# Patient Record
Sex: Female | Born: 1990
Health system: Southern US, Community
[De-identification: ages and names within clinical notes are randomized; demographics above are authoritative.]

## PROBLEM LIST (undated history)

## (undated) ENCOUNTER — Inpatient Hospital Stay (HOSPITAL_COMMUNITY): Payer: Self-pay

---

## 2015-09-22 ENCOUNTER — Encounter (HOSPITAL_BASED_OUTPATIENT_CLINIC_OR_DEPARTMENT_OTHER): Payer: Self-pay

## 2015-09-22 ENCOUNTER — Emergency Department (HOSPITAL_BASED_OUTPATIENT_CLINIC_OR_DEPARTMENT_OTHER): Payer: Self-pay

## 2015-09-22 ENCOUNTER — Emergency Department (HOSPITAL_BASED_OUTPATIENT_CLINIC_OR_DEPARTMENT_OTHER)
Admission: EM | Admit: 2015-09-22 | Discharge: 2015-09-23 | Disposition: A | Discharge status: Home / Self Care | Payer: Self-pay | Attending: Emergency Medicine | Admitting: Emergency Medicine

## 2015-09-22 DIAGNOSIS — F17200 Nicotine dependence, unspecified, uncomplicated: Secondary | ICD-10-CM | POA: Insufficient documentation

## 2015-09-22 DIAGNOSIS — O200 Threatened abortion: Secondary | ICD-10-CM | POA: Insufficient documentation

## 2015-09-22 DIAGNOSIS — O26891 Other specified pregnancy related conditions, first trimester: Secondary | ICD-10-CM

## 2015-09-22 DIAGNOSIS — R1084 Generalized abdominal pain: Secondary | ICD-10-CM | POA: Insufficient documentation

## 2015-09-22 DIAGNOSIS — O210 Mild hyperemesis gravidarum: Secondary | ICD-10-CM | POA: Insufficient documentation

## 2015-09-22 DIAGNOSIS — R102 Pelvic and perineal pain: Secondary | ICD-10-CM

## 2015-09-22 DIAGNOSIS — R52 Pain, unspecified: Secondary | ICD-10-CM

## 2015-09-22 DIAGNOSIS — O99331 Smoking (tobacco) complicating pregnancy, first trimester: Secondary | ICD-10-CM | POA: Insufficient documentation

## 2015-09-22 DIAGNOSIS — Z3A01 Less than 8 weeks gestation of pregnancy: Secondary | ICD-10-CM | POA: Insufficient documentation

## 2015-09-22 DIAGNOSIS — R109 Unspecified abdominal pain: Secondary | ICD-10-CM

## 2015-09-22 LAB — URINALYSIS, ROUTINE W REFLEX MICROSCOPIC
Bilirubin Urine: NEGATIVE
Glucose, UA: NEGATIVE mg/dL
Hgb urine dipstick: NEGATIVE
Ketones, ur: NEGATIVE mg/dL
LEUKOCYTES UA: NEGATIVE
NITRITE: NEGATIVE
PH: 8.5 — AB (ref 5.0–8.0)
Protein, ur: NEGATIVE mg/dL
SPECIFIC GRAVITY, URINE: 1.015 (ref 1.005–1.030)

## 2015-09-22 LAB — PREGNANCY, URINE: PREG TEST UR: POSITIVE — AB

## 2015-09-22 NOTE — ED Provider Notes (Signed)
CSN: [id number]     Arrival date & time 09/22/15  2213 History  By signing my name below, I, Phillis HaggisGabriella Gaje, attest that this documentation has been prepared under the direction and in the presence of Calab Sachse, MD. Electronically Signed: Phillis HaggisGabriella Gaje, ED Scribe. 09/22/2015. 11:50 PM.  Chief Complaint  Patient presents with  . Abdominal Pain   Patient is a 25 y.o. female presenting with abdominal pain. The history is provided by the patient. No language interpreter was used.  Abdominal Pain Pain location:  Generalized Pain quality: cramping   Pain radiates to:  Does not radiate Pain severity:  Mild Onset quality:  Gradual Timing:  Constant Progression:  Unchanged Chronicity:  New Context: not alcohol use and not trauma   Relieved by:  Nothing Worsened by:  Nothing tried Ineffective treatments:  None tried Associated symptoms: diarrhea, nausea and vomiting   Associated symptoms: no dysuria, no hematuria, no vaginal bleeding and no vaginal discharge   Risk factors: no alcohol abuse   HPI Comments: Leslie Andreaamecca Haynes McCants is a 25 y.o. female who presents to the Emergency Department complaining of right sided abdominal pain onset 2 weeks ago. She reports associated nausea, vomiting x1, and diarrhea. LMP January 2017. She does not have current plans for OB care. She denies vaginal discharge, dysuria, or hematuria.   History reviewed. No pertinent past medical history. Past Surgical History  Procedure Laterality Date  . Cesarean section     No family history on file. Social History  Substance Use Topics  . Smoking status: Current Every Day Smoker  . Smokeless tobacco: None  . Alcohol Use: No   OB History    No data available     Review of Systems  Gastrointestinal: Positive for nausea, vomiting, abdominal pain and diarrhea.  Genitourinary: Negative for dysuria, hematuria, flank pain, vaginal bleeding, vaginal discharge and pelvic pain.  All other systems reviewed and are  negative.  Allergies  Review of patient's allergies indicates no known allergies.  Home Medications   Prior to Admission medications   Not on File   BP 125/74 mmHg  Pulse 89  Temp(Src) 98.7 F (37.1 C) (Oral)  Resp 16  Ht 5\' 3"  (1.6 m)  Wt 154 lb (69.854 kg)  BMI 27.29 kg/m2  SpO2 100%  LMP 07/18/2015 Physical Exam  Constitutional: She is oriented to person, place, and time. She appears well-developed and well-nourished. No distress.  HENT:  Head: Normocephalic and atraumatic.  Mouth/Throat: Oropharynx is clear and moist. No oropharyngeal exudate.  Trachea midline  Eyes: Conjunctivae and EOM are normal. Pupils are equal, round, and reactive to light.  Neck: Trachea normal and normal range of motion. Neck supple. No JVD present. Carotid bruit is not present.  Cardiovascular: Normal rate and regular rhythm.  Exam reveals no gallop and no friction rub.   No murmur heard. Pulmonary/Chest: Effort normal and breath sounds normal. No stridor. She has no wheezes. She has no rales.  Abdominal: Soft. Bowel sounds are normal. She exhibits no mass. There is no tenderness. There is no rebound, no guarding, no tenderness at McBurney's point and negative Murphy's sign.  Genitourinary: Uterus normal.  Musculoskeletal: Normal range of motion.  Lymphadenopathy:    She has no cervical adenopathy.  Neurological: She is alert and oriented to person, place, and time. She has normal reflexes. No cranial nerve deficit. She exhibits normal muscle tone. Coordination normal.  Cranial nerves 2-12 intact; steady gait  Skin: Skin is warm and dry. She is  not diaphoretic.  Psychiatric: She has a normal mood and affect. Her behavior is normal.    ED Course  Procedures (including critical care time) DIAGNOSTIC STUDIES: Oxygen Saturation is 100% on RA, normal by my interpretation.    COORDINATION OF CARE: 11:50 PM-Discussed treatment plan which includes UA and ultrasound with pt at bedside and pt agreed  to plan.    Labs Review Labs Reviewed  URINALYSIS, ROUTINE W REFLEX MICROSCOPIC (NOT AT [facility]) - Abnormal; Notable for the following:    APPearance HAZY (*)    pH 8.5 (*)    All other components within normal limits  PREGNANCY, URINE - Abnormal; Notable for the following:    Preg Test, Ur POSITIVE (*)    All other components within normal limits  HCG, QUANTITATIVE, PREGNANCY    Imaging Review No results found. I have personally reviewed and evaluated these images and lab results as part of my medical decision-making.   EKG Interpretation None      MDM   Final diagnoses:  None    205 am case d/w Dr. Emelda Fear. Labs and [place] reviewed with MD via phone.  Likely miscarriage but will need follow up.  Patient to be seen at [facility] Saturday at 8 am.  MD took patient's info for MAU  Discussed all information with patient, confidentially.  Patient expresses understanding of all information and agrees to follow up at [facility] Saturday at 8 am for repeat blood work  I personally performed the services described in this documentation, which was scribed in my presence. The recorded information has been reviewed and is accurate.       Cy Blamer, MD 09/23/15 [phone]

## 2015-09-22 NOTE — ED Notes (Addendum)
C/o abd pain, n/v/d x 2 weeks-denies vaginal d/c, urinary s/s-NAD-steady gait

## 2015-09-22 NOTE — ED Notes (Signed)
MD at bedside. 

## 2015-09-23 ENCOUNTER — Emergency Department (HOSPITAL_BASED_OUTPATIENT_CLINIC_OR_DEPARTMENT_OTHER): Payer: Self-pay

## 2015-09-23 ENCOUNTER — Encounter (HOSPITAL_BASED_OUTPATIENT_CLINIC_OR_DEPARTMENT_OTHER): Payer: Self-pay | Admitting: Emergency Medicine

## 2015-09-23 LAB — HCG, QUANTITATIVE, PREGNANCY: HCG, BETA CHAIN, QUANT, S: 7267 m[IU]/mL — AB (ref <5)

## 2015-09-23 NOTE — ED Notes (Signed)
MD at bedside updating pt on test results and dispo plan of care.

## 2015-09-25 ENCOUNTER — Encounter (HOSPITAL_COMMUNITY): Payer: Self-pay | Admitting: *Deleted

## 2015-09-25 ENCOUNTER — Inpatient Hospital Stay (HOSPITAL_COMMUNITY)
Admission: AD | Admit: 2015-09-25 | Discharge: 2015-09-25 | Disposition: A | Discharge status: Home / Self Care | Payer: Self-pay | Source: Ambulatory Visit | Attending: Obstetrics & Gynecology | Admitting: Obstetrics & Gynecology

## 2015-09-25 DIAGNOSIS — Z3A01 Less than 8 weeks gestation of pregnancy: Secondary | ICD-10-CM | POA: Insufficient documentation

## 2015-09-25 DIAGNOSIS — O26899 Other specified pregnancy related conditions, unspecified trimester: Secondary | ICD-10-CM

## 2015-09-25 DIAGNOSIS — O9989 Other specified diseases and conditions complicating pregnancy, childbirth and the puerperium: Secondary | ICD-10-CM

## 2015-09-25 DIAGNOSIS — O26891 Other specified pregnancy related conditions, first trimester: Secondary | ICD-10-CM | POA: Insufficient documentation

## 2015-09-25 DIAGNOSIS — R109 Unspecified abdominal pain: Secondary | ICD-10-CM

## 2015-09-25 DIAGNOSIS — R102 Pelvic and perineal pain: Secondary | ICD-10-CM | POA: Insufficient documentation

## 2015-09-25 LAB — HCG, QUANTITATIVE, PREGNANCY: hCG, Beta Chain, Quant, S: 11418 m[IU]/mL — ABNORMAL HIGH (ref <5)

## 2015-09-25 NOTE — MAU Provider Note (Signed)
Kim Mccullough is a 25 y.o. at [redacted]w[redacted]d EGA here for follow up quant HCG. Vaginal bleeding: none. Abdominal pain: none. She was originally seen at [facility] ED for abdominal pain on 09/23/15.   Prior HCGs: 7267 on 09/23/15   Prior U/S:  US Ob Comp Less 14 Wks  09/23/2015  CLINICAL DATA:  Pelvic pain for 1 week. Positive pregnancy test. Unknown dates. LMP was first week of January. Estimated gestational age by a this LMP would be 9 weeks 1 day. No quantitative beta HCG levels available. EXAM: OBSTETRIC <14 WK [place] AND TRANSVAGINAL OB US TECHNIQUE: Both transabdominal and transvaginal ultrasound examinations were performed for complete evaluation of the gestation as well as the maternal uterus, adnexal regions, and pelvic cul-de-sac. Transvaginal technique was performed to assess early pregnancy. COMPARISON:  None. FINDINGS: Intrauterine gestational sac: A single intrauterine gestational sac is visualized. Yolk sac: Internal structure within the gestational sac may represent early yolk sac. Embryo:  Fetal pole is not identified. Cardiac Activity: Not identified. MSD: 8.2  mm   5 w   4  d                  [place] EDC: 05/21/2016 Subchorionic hemorrhage:  Indeterminate. Maternal uterus/adnexae: Uterus is anteverted. No myometrial mass lesions. The endometrium is expanded and heterogeneous. This could represent a large subchorionic hemorrhage or an additional endometrial process such as hemorrhage or hyperplasia. No endometrial fluid collections are demonstrated other than a gestational sac. Both ovaries are visualized and appear normal. Corpus luteum cyst demonstrated on the right. Small amount of free fluid in the pelvis. IMPRESSION: Probable early intrauterine gestational sac, but no definite yolk sac, fetal pole, or cardiac activity yet visualized. Recommend follow-up quantitative B-HCG levels and follow-up US in 14 days to confirm and assess viability. This recommendation follows SRU consensus guidelines:  Diagnostic Criteria for Nonviable Pregnancy Early in the First Trimester. Malva Limes Med 2013; 161:0960-45. Expansion and heterogeneous appearance of the endometrium could represent a large subchorionic hemorrhage versus underlying endometrial process. Follow-up suggested. Electronically Signed   By: Burman Nieves M.D.   On: 09/23/2015 01:07   US Ob Transvaginal  09/23/2015  CLINICAL DATA:  Pelvic pain for 1 week. Positive pregnancy test. Unknown dates. LMP was first week of January. Estimated gestational age by a this LMP would be 9 weeks 1 day. No quantitative beta HCG levels available. EXAM: OBSTETRIC <14 WK [place] AND TRANSVAGINAL OB US TECHNIQUE: Both transabdominal and transvaginal ultrasound examinations were performed for complete evaluation of the gestation as well as the maternal uterus, adnexal regions, and pelvic cul-de-sac. Transvaginal technique was performed to assess early pregnancy. COMPARISON:  None. FINDINGS: Intrauterine gestational sac: A single intrauterine gestational sac is visualized. Yolk sac: Internal structure within the gestational sac may represent early yolk sac. Embryo:  Fetal pole is not identified. Cardiac Activity: Not identified. MSD: 8.2  mm   5 w   4  d                  [place] EDC: 05/21/2016 Subchorionic hemorrhage:  Indeterminate. Maternal uterus/adnexae: Uterus is anteverted. No myometrial mass lesions. The endometrium is expanded and heterogeneous. This could represent a large subchorionic hemorrhage or an additional endometrial process such as hemorrhage or hyperplasia. No endometrial fluid collections are demonstrated other than a gestational sac. Both ovaries are visualized and appear normal. Corpus luteum cyst demonstrated on the right. Small amount of free fluid in the pelvis. IMPRESSION: Probable early  intrauterine gestational sac, but no definite yolk sac, fetal pole, or cardiac activity yet visualized. Recommend follow-up quantitative B-HCG levels and follow-up US in 14  days to confirm and assess viability. This recommendation follows SRU consensus guidelines: Diagnostic Criteria for Nonviable Pregnancy Early in the First Trimester. Malva Limes Engl J Med 2013; 119:1478-29; 369:1443-51. Expansion and heterogeneous appearance of the endometrium could represent a large subchorionic hemorrhage versus underlying endometrial process. Follow-up suggested. Electronically Signed   By: Burman NievesWilliam  Stevens M.D.   On: 09/23/2015 01:07     No past medical history on file.  No prescriptions prior to admission    No Known Allergies  Review of Systems - negative  Objective BP 124/60 mmHg  Pulse 82  Temp(Src) 98.5 F (36.9 C)  Resp 18  Wt 154 lb (69.854 kg)  LMP 07/18/2015  General: Alert, oriented, no acute distress  Results for orders placed or performed during the hospital encounter of 09/25/15 (from the past 24 hour(s))  hCG, quantitative, pregnancy     Status: Abnormal   Collection Time: 09/25/15  8:27 AM  Result Value Ref Range   hCG, Beta Chain, Quant, S 11418 (H) <5 mIU/mL    Assessment  1. Abdominal pain in pregnancy, antepartum   Abdominal pain resolved, quant increasing, no bleeding or pain.   Plan Repeat u/s in 1 week to follow up for viability, rev'd w/ Dr. Erin FullingHarraway-Smith, precautions rev'd     Follow-up Information    [facility] 1 week.   Specialty:  Radiology   Why:  someone will call to schedule your appointment   Contact information:   [address] [id number] [address] [phone]      [facility] LL[facility]RAZIER,Jamea Robicheaux 10:43 AM 09/25/2015

## 2015-09-25 NOTE — MAU Note (Signed)
Pt presents to MAU for follow up quant. [facility] for lower abdominal pain. Pt denies any vaginal bleeding or pain at present.

## 2015-10-05 ENCOUNTER — Ambulatory Visit (INDEPENDENT_AMBULATORY_CARE_PROVIDER_SITE_OTHER): Payer: Self-pay | Admitting: Certified Nurse Midwife

## 2015-10-05 ENCOUNTER — Ambulatory Visit (HOSPITAL_COMMUNITY)
Admission: RE | Admit: 2015-10-05 | Discharge: 2015-10-05 | Disposition: A | Discharge status: Home / Self Care | Payer: Self-pay | Source: Ambulatory Visit | Attending: Advanced Practice Midwife | Admitting: Advanced Practice Midwife

## 2015-10-05 DIAGNOSIS — R109 Unspecified abdominal pain: Secondary | ICD-10-CM | POA: Insufficient documentation

## 2015-10-05 DIAGNOSIS — O26899 Other specified pregnancy related conditions, unspecified trimester: Secondary | ICD-10-CM

## 2015-10-05 DIAGNOSIS — O3680X0 Pregnancy with inconclusive fetal viability, not applicable or unspecified: Secondary | ICD-10-CM | POA: Insufficient documentation

## 2015-10-05 DIAGNOSIS — O208 Other hemorrhage in early pregnancy: Secondary | ICD-10-CM | POA: Insufficient documentation

## 2015-10-05 DIAGNOSIS — O3680X1 Pregnancy with inconclusive fetal viability, fetus 1: Secondary | ICD-10-CM

## 2015-10-05 DIAGNOSIS — O283 Abnormal ultrasonic finding on antenatal screening of mother: Secondary | ICD-10-CM | POA: Insufficient documentation

## 2015-10-05 DIAGNOSIS — O26891 Other specified pregnancy related conditions, first trimester: Secondary | ICD-10-CM | POA: Insufficient documentation

## 2015-10-05 DIAGNOSIS — Z3A01 Less than 8 weeks gestation of pregnancy: Secondary | ICD-10-CM | POA: Insufficient documentation

## 2015-10-05 NOTE — Progress Notes (Signed)
Ultrasounds Results Note  SUBJECTIVE HPI: Presents to clinic for U/S results consultation Ms. Kim Mccullough is a 25 y.o. [phone] at [redacted]w[redacted]d by LMP who presents to the [facility] for followup ultrasound results. The patient denies abdominal pain or vaginal bleeding.  .  Repeat ultrasound was performed earlier today. Positive Fetal Heart Tones; EDC-11/717  No past medical history on file. Past Surgical History  Procedure Laterality Date  . Cesarean section     Social History   Social History  . Marital Status: Single    Spouse Name: N/A  . Number of Children: N/A  . Years of Education: N/A   Occupational History  . Not on file.   Social History Main Topics  . Smoking status: Current Every Day Smoker  . Smokeless tobacco: Not on file  . Alcohol Use: No  . Drug Use: No  . Sexual Activity: Yes    Birth Control/ Protection: None   Other Topics Concern  . Not on file   Social History Narrative   No current outpatient prescriptions on file prior to visit.   No current facility-administered medications on file prior to visit.   No Known Allergies  I have reviewed patient's Past Medical Hx, Surgical Hx, Family Hx, Social Hx, medications and allergies.   Review of Systems Review of Systems  Constitutional: Negative for fever and chills.  Gastrointestinal: Negative for nausea, vomiting, abdominal pain, diarrhea and constipation.  Genitourinary: Negative for dysuria.  Musculoskeletal: Negative for back pain.  Neurological: Negative for dizziness and weakness.    Physical Exam  LMP 07/18/2015  GENERAL: Well-developed, well-nourished female in no acute distress.  HEENT: Normocephalic, atraumatic.   LUNGS: Effort normal ABDOMEN: soft, non-tender HEART: Regular rate  SKIN: Warm, dry and without erythema PSYCH: Normal mood and affect NEURO: Alert and oriented x 4  LAB RESULTS No results found for this or any previous visit (from the past 24  hour(s)).  IMAGING US Ob Comp Less 14 Wks  09/23/2015  CLINICAL DATA:  Pelvic pain for 1 week. Positive pregnancy test. Unknown dates. LMP was first week of January. Estimated gestational age by a this LMP would be 9 weeks 1 day. No quantitative beta HCG levels available. EXAM: OBSTETRIC <14 WK [place] AND TRANSVAGINAL OB US TECHNIQUE: Both transabdominal and transvaginal ultrasound examinations were performed for complete evaluation of the gestation as well as the maternal uterus, adnexal regions, and pelvic cul-de-sac. Transvaginal technique was performed to assess early pregnancy. COMPARISON:  None. FINDINGS: Intrauterine gestational sac: A single intrauterine gestational sac is visualized. Yolk sac: Internal structure within the gestational sac may represent early yolk sac. Embryo:  Fetal pole is not identified. Cardiac Activity: Not identified. MSD: 8.2  mm   5 w   4  d                  [place] EDC: 05/21/2016 Subchorionic hemorrhage:  Indeterminate. Maternal uterus/adnexae: Uterus is anteverted. No myometrial mass lesions. The endometrium is expanded and heterogeneous. This could represent a large subchorionic hemorrhage or an additional endometrial process such as hemorrhage or hyperplasia. No endometrial fluid collections are demonstrated other than a gestational sac. Both ovaries are visualized and appear normal. Corpus luteum cyst demonstrated on the right. Small amount of free fluid in the pelvis. IMPRESSION: Probable early intrauterine gestational sac, but no definite yolk sac, fetal pole, or cardiac activity yet visualized. Recommend follow-up quantitative B-HCG levels and follow-up US in 14 days to confirm and assess viability. This  recommendation follows SRU consensus guidelines: Diagnostic Criteria for Nonviable Pregnancy Early in the First Trimester. Malva Limes Engl J Med 2013; 295:1884-16; 369:1443-51. Expansion and heterogeneous appearance of the endometrium could represent a large subchorionic hemorrhage versus underlying  endometrial process. Follow-up suggested. Electronically Signed   By: Burman NievesWilliam  Stevens M.D.   On: 09/23/2015 01:07   [place] Ob Transvaginal  09/23/2015  CLINICAL DATA:  Pelvic pain for 1 week. Positive pregnancy test. Unknown dates. LMP was first week of January. Estimated gestational age by a this LMP would be 9 weeks 1 day. No quantitative beta HCG levels available. EXAM: OBSTETRIC <14 WK US AND TRANSVAGINAL OB US TECHNIQUE: Both transabdominal and transvaginal ultrasound examinations were performed for complete evaluation of the gestation as well as the maternal uterus, adnexal regions, and pelvic cul-de-sac. Transvaginal technique was performed to assess early pregnancy. COMPARISON:  None. FINDINGS: Intrauterine gestational sac: A single intrauterine gestational sac is visualized. Yolk sac: Internal structure within the gestational sac may represent early yolk sac. Embryo:  Fetal pole is not identified. Cardiac Activity: Not identified. MSD: 8.2  mm   5 w   4  d                  US EDC: 05/21/2016 Subchorionic hemorrhage:  Indeterminate. Maternal uterus/adnexae: Uterus is anteverted. No myometrial mass lesions. The endometrium is expanded and heterogeneous. This could represent a large subchorionic hemorrhage or an additional endometrial process such as hemorrhage or hyperplasia. No endometrial fluid collections are demonstrated other than a gestational sac. Both ovaries are visualized and appear normal. Corpus luteum cyst demonstrated on the right. Small amount of free fluid in the pelvis. IMPRESSION: Probable early intrauterine gestational sac, but no definite yolk sac, fetal pole, or cardiac activity yet visualized. Recommend follow-up quantitative B-HCG levels and follow-up US in 14 days to confirm and assess viability. This recommendation follows SRU consensus guidelines: Diagnostic Criteria for Nonviable Pregnancy Early in the First Trimester. Malva Limes Engl J Med 2013; 606:3016-01; 369:1443-51. Expansion and heterogeneous  appearance of the endometrium could represent a large subchorionic hemorrhage versus underlying endometrial process. Follow-up suggested. Electronically Signed   By: Burman NievesWilliam  Stevens M.D.   On: 09/23/2015 01:07    ASSESSMENT 1. Encounter to determine fetal viability of pregnancy, fetus 1     PLAN Discharge home in stable condition Patient advised to start/continue taking prenatal vitamins  Pregnancy confirmation letter given Patient advised to [facility] as possible  Rhea PinkLori A Clemmons, CNM  10/05/2015  11:00 AM

## 2016-07-30 ENCOUNTER — Encounter (HOSPITAL_COMMUNITY): Payer: Self-pay

## 2016-08-17 IMAGING — US US OB TRANSVAGINAL
1 series · 15 of 28 positions shown · non-contrast
Comparison: 09/23/2015

CLINICAL DATA: Abdominal pain. First trimester pregnancy with
inconclusive fetal viability.

EXAM:
TRANSVAGINAL OB ULTRASOUND
TECHNIQUE: Transvaginal ultrasound was performed for complete evaluation of the
gestation as well as the maternal uterus, adnexal regions, and
pelvic cul-de-sac.

[Series 1: us ob transvaginal · 15 of 29 slices shown]
[im 1/29]
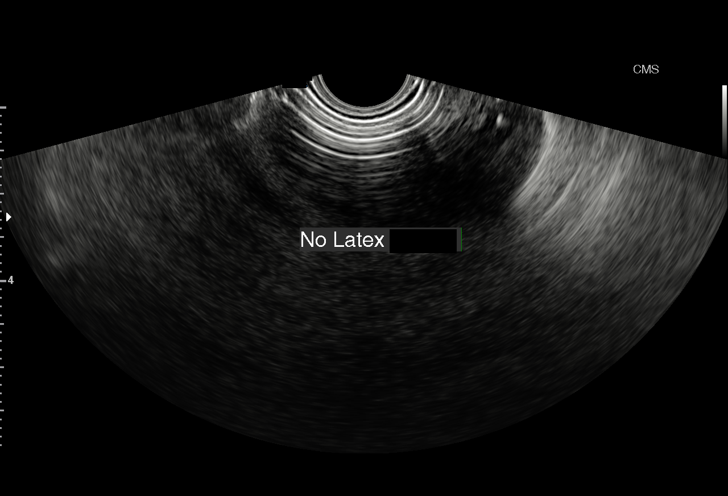
[im 3/29]
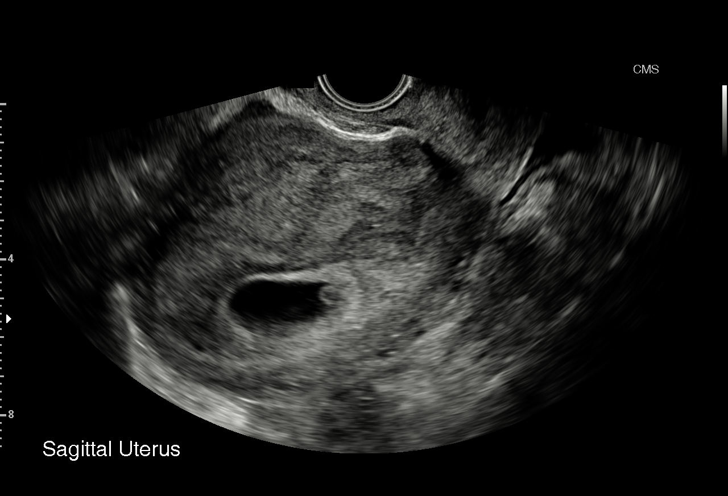
[im 5/29]
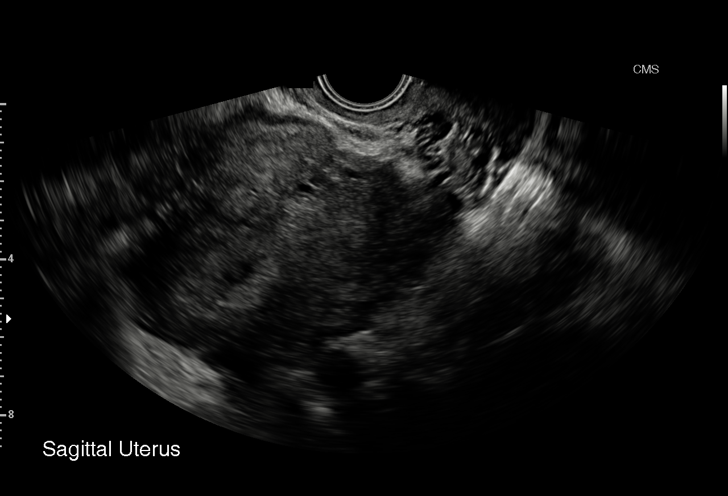
[im 7/29]
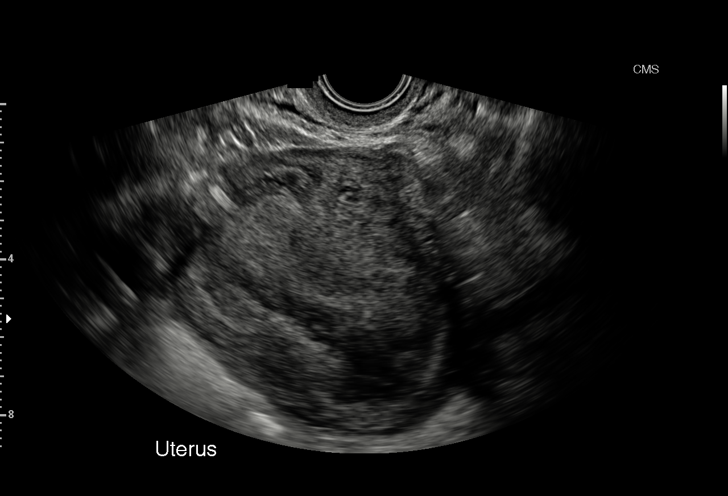
[im 9/29]
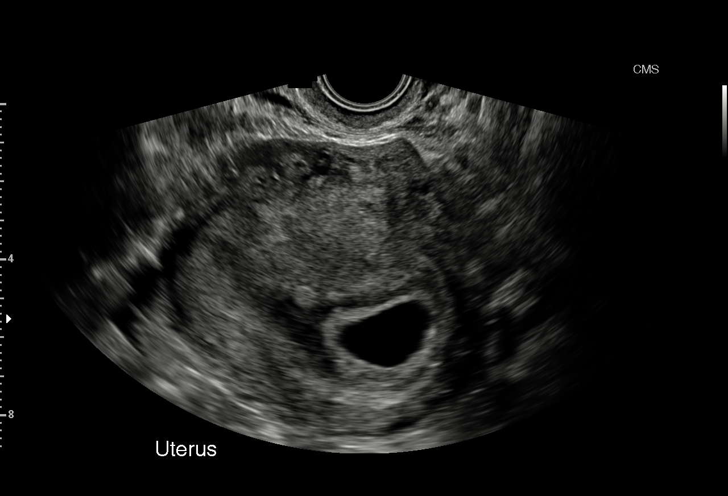
[im 11/29]
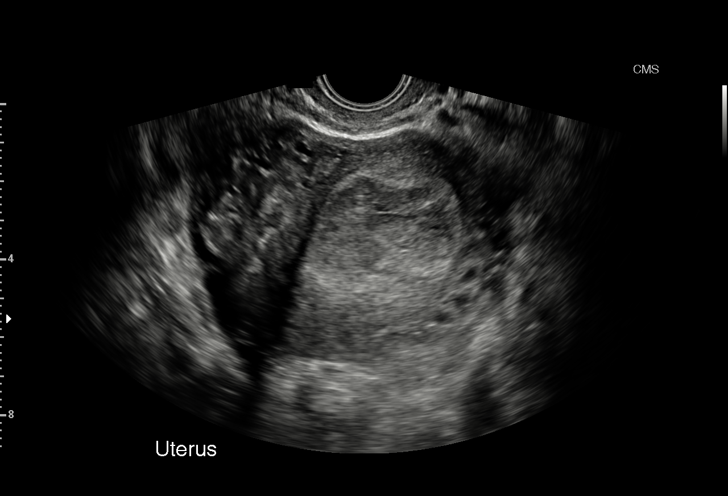
[im 13/29]
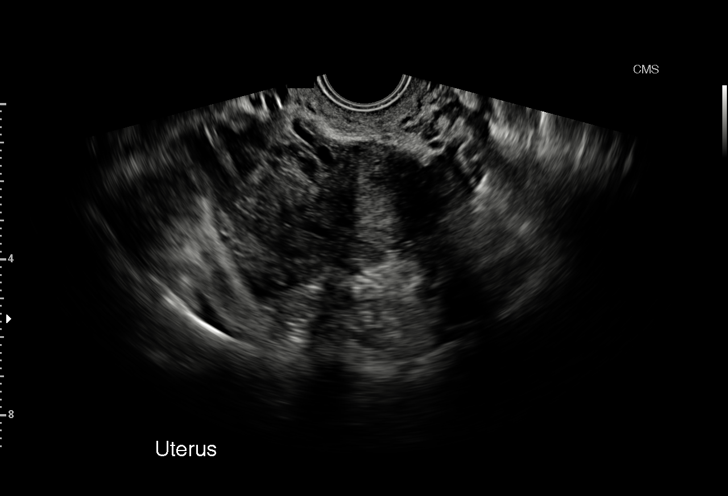
[im 15/29]
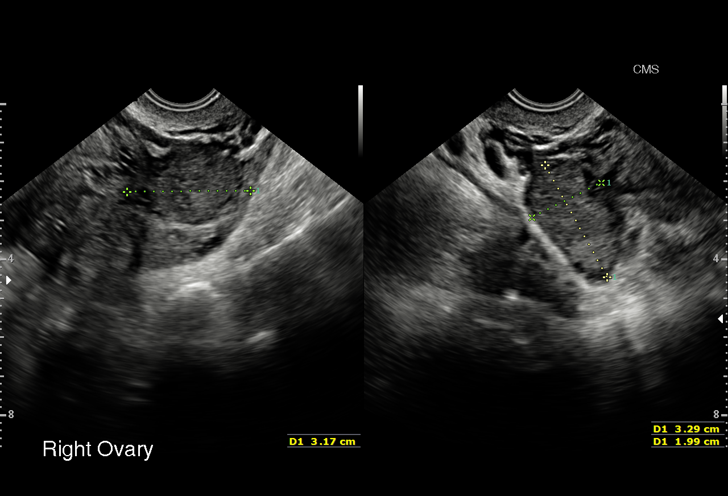
[im 16/29]
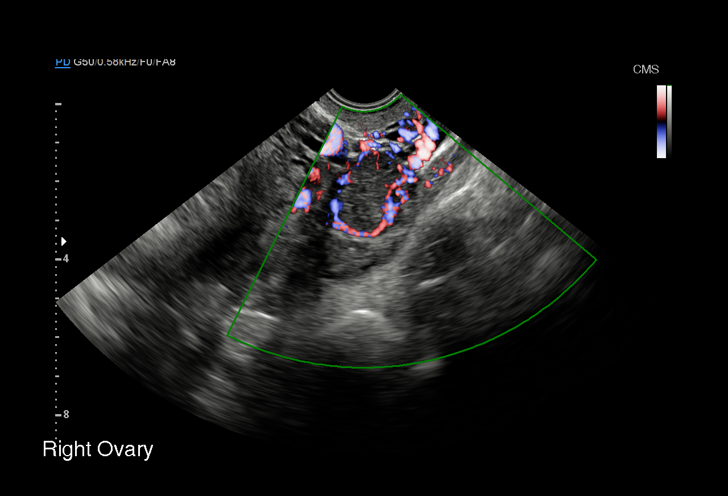
[im 18/29]
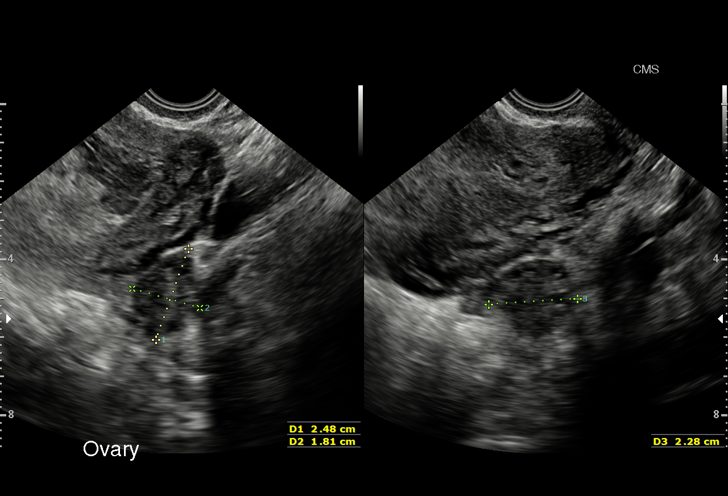
[im 20/29]
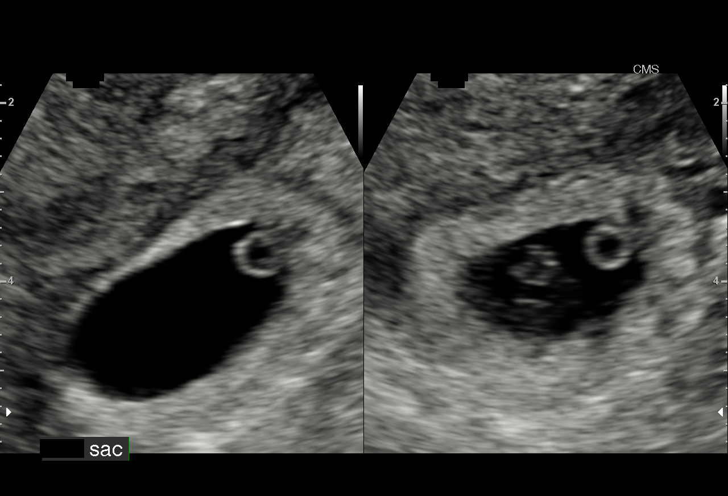
[im 22/29]
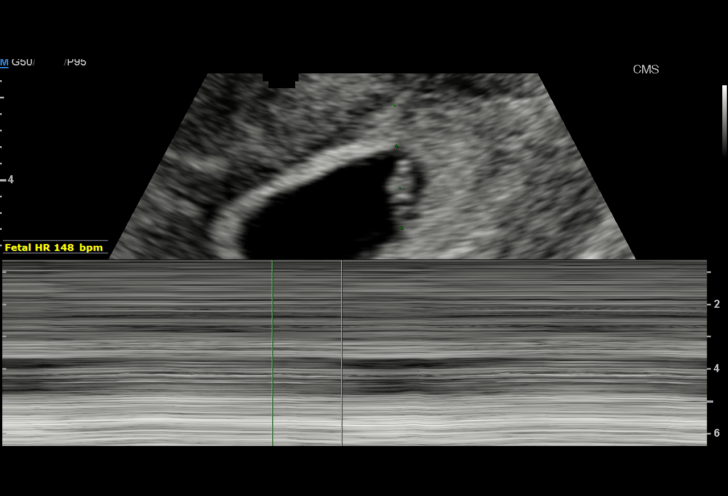
[im 24/29]
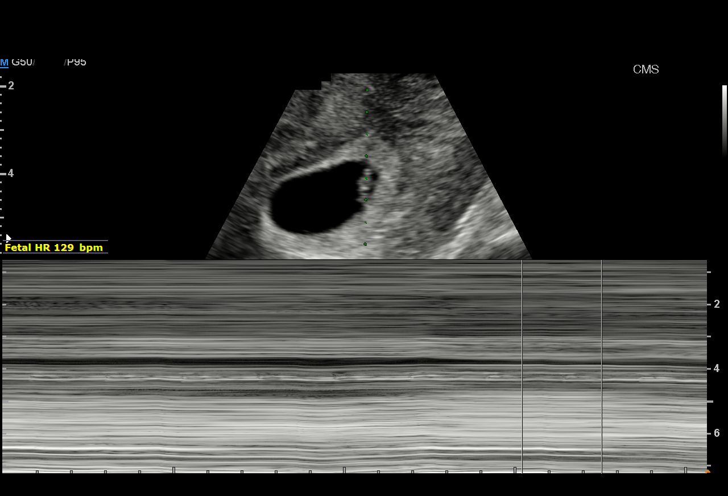
[im 26/29]
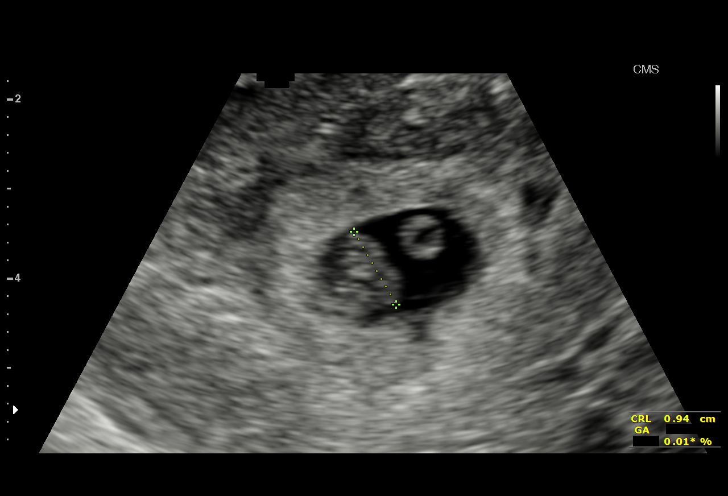
[im 29/29]
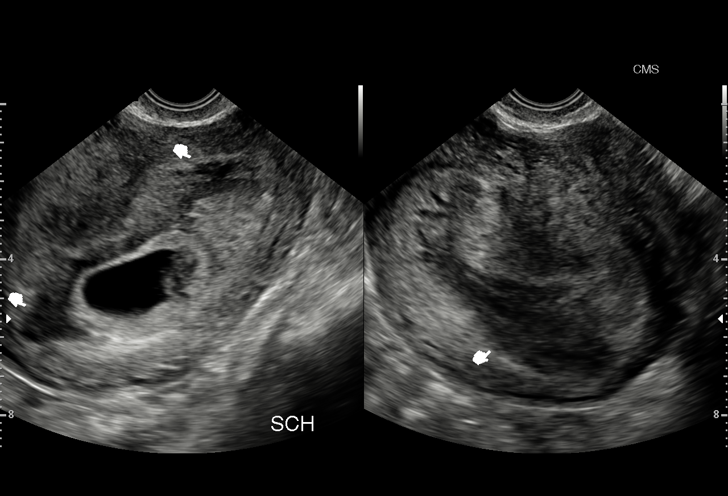

[15 of 28 positions shown; findings below may reference images not displayed]

FINDINGS: Intrauterine gestational sac: Visualized/normal in shape.

Yolk sac:  Visualized

Embryo:  Visualized

Cardiac Activity: Visualized

Heart Rate: 148 bpm

CRL:   9  mm   7 w 0 d                  US EDC: 05/23/2016

Subchorionic hemorrhage:  Small subchorionic hemorrhage noted.

Maternal uterus/adnexae: Normal appearance of both ovaries. No mass
or abnormal free fluid visualized.
IMPRESSION: Single living IUP measuring 7 weeks 0 days with US EDC of
05/23/2016. Appropriate progression since prior exam.

Small subchorionic hemorrhage noted.

## 2016-08-29 IMAGING — US US OB COMP LESS 14 WK
1 series · 13 of 28 positions shown · non-contrast
Comparison: None.

CLINICAL DATA: Pelvic pain for 1 week. Positive pregnancy test.
Unknown dates. LMP was first week [DATE]. Estimated gestational
age by a this LMP would be 9 weeks 1 day. No quantitative beta HCG
levels available.

EXAM:
OBSTETRIC <14 WK US AND TRANSVAGINAL OB US
TECHNIQUE: Both transabdominal and transvaginal ultrasound examinations were
performed for complete evaluation of the gestation as well as the
maternal uterus, adnexal regions, and pelvic cul-de-sac.
Transvaginal technique was performed to assess early pregnancy.

[Series 2: us ob comp less 14 wk · 0.10mm/px · 41 acquisitions, 13 frames shown]
[im 2/41]
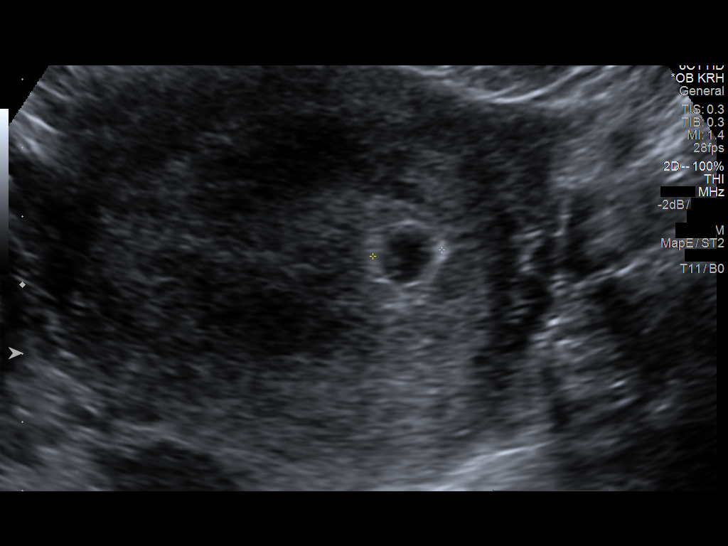
[im 5/41]
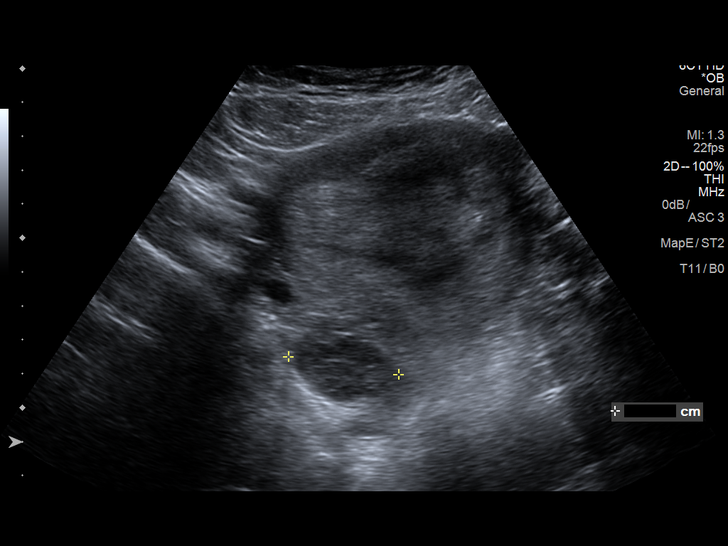
[im 8/41]
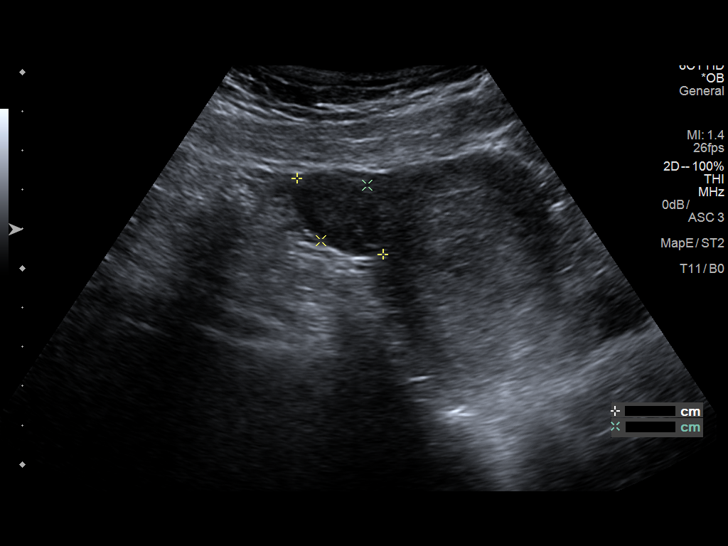
[im 11/41]
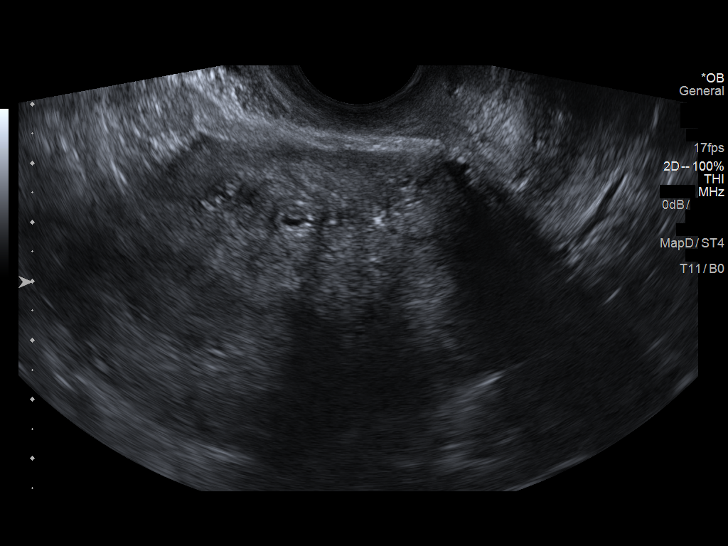
[im 14/41]
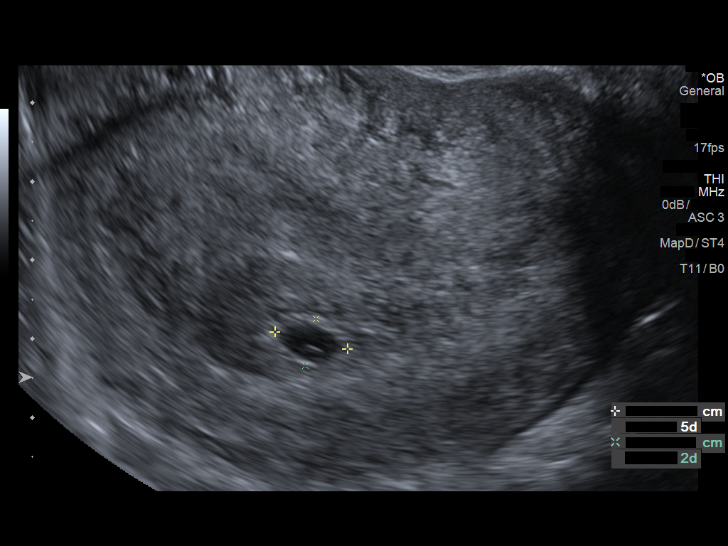
[im 17/41]
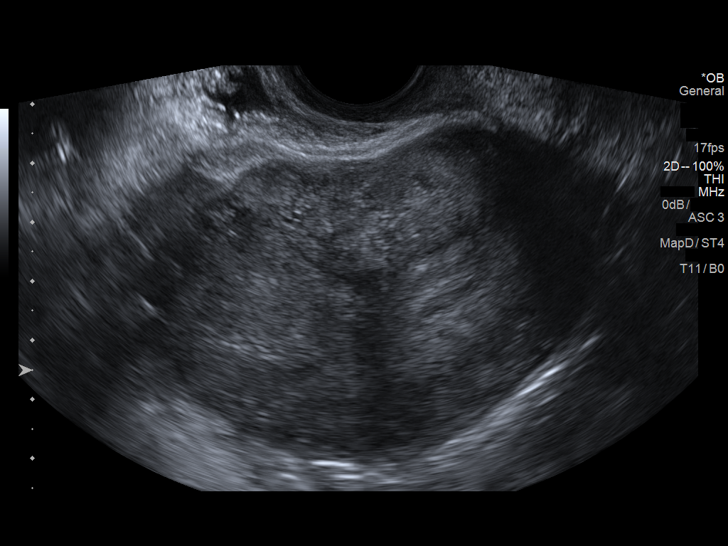
[im 21/41]
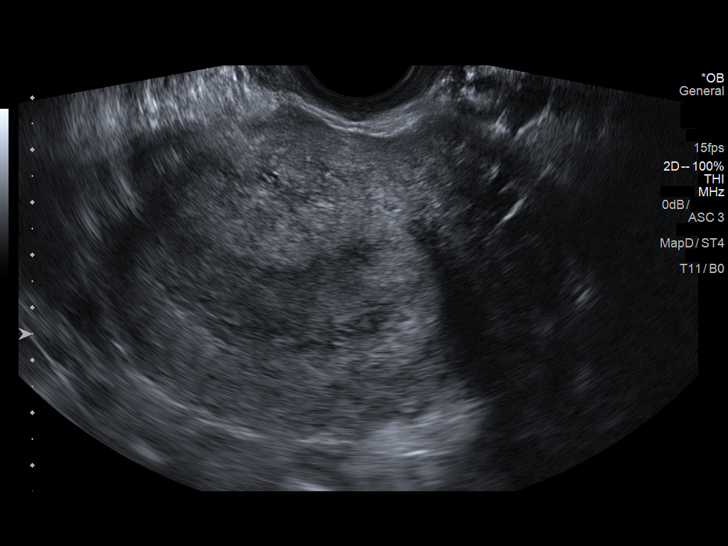
[im 24/41]
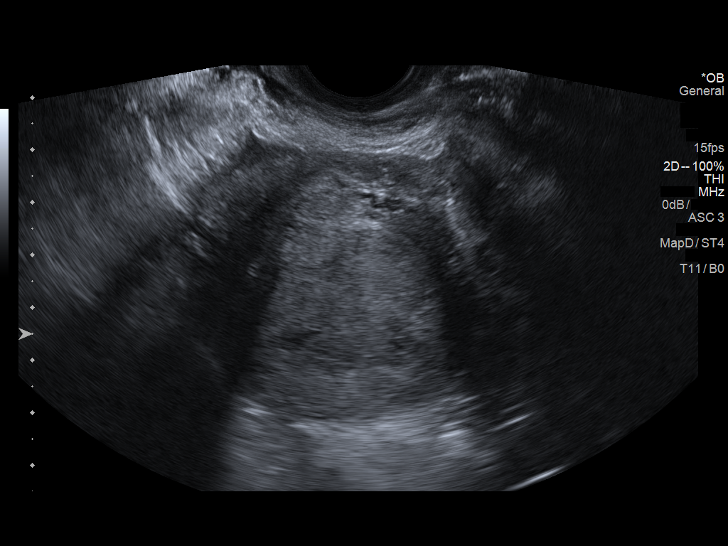
[im 27/41]
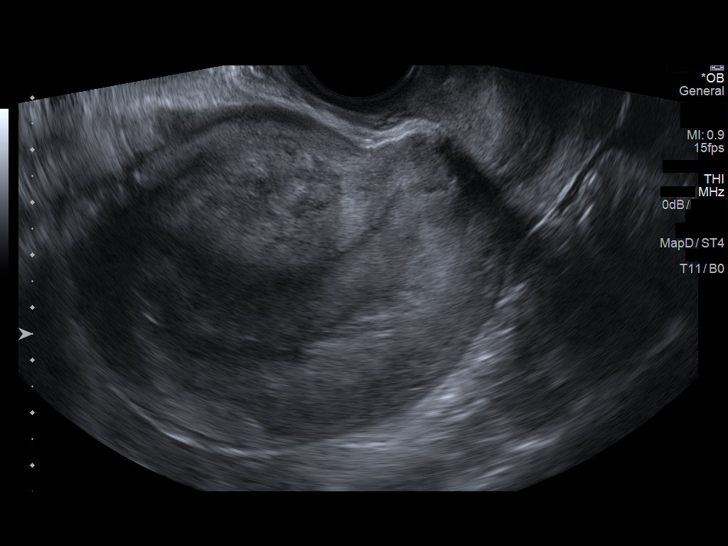
[im 30/41]
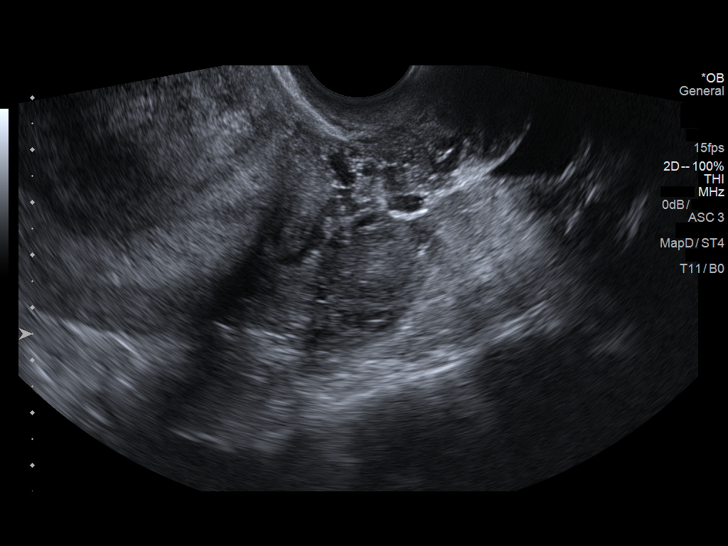
[im 33/41]
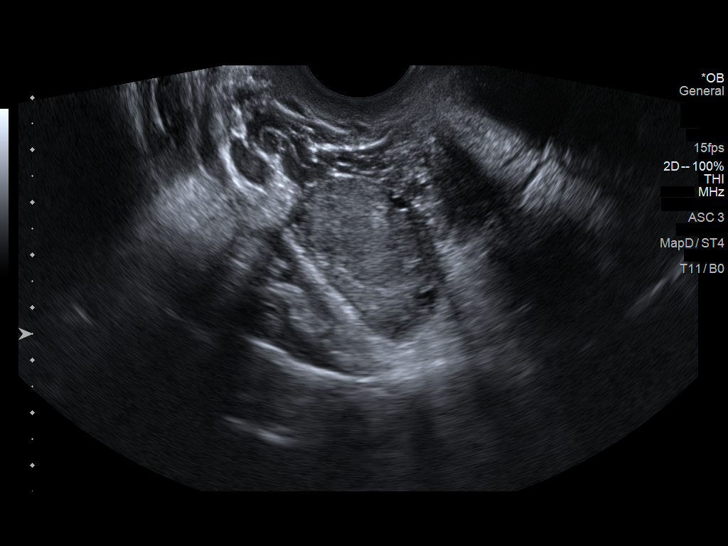
[im 36/41]
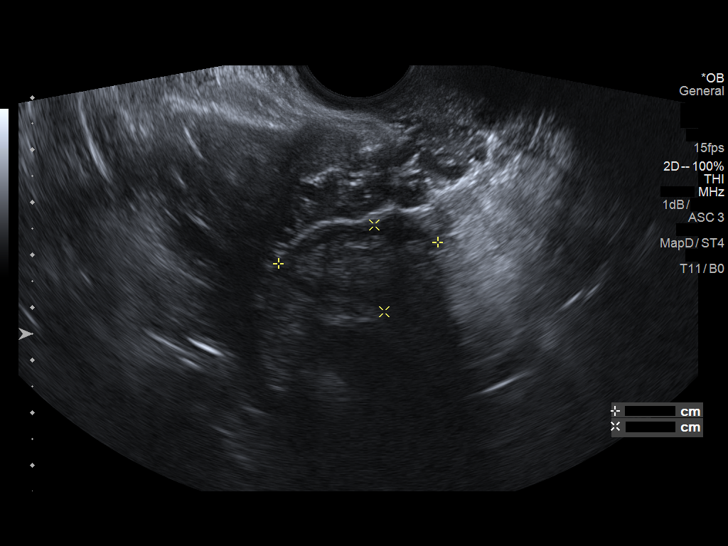
[im 39/41]
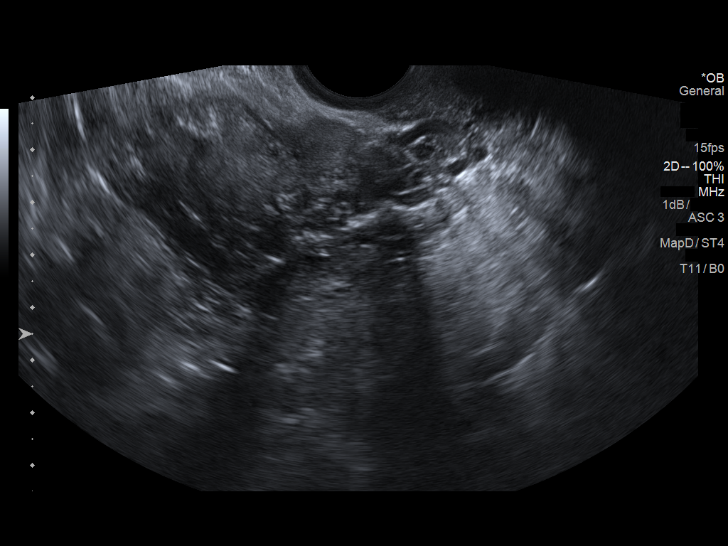

[13 of 28 positions shown; findings below may reference images not displayed]

FINDINGS: Intrauterine gestational sac: A single intrauterine gestational sac
is visualized.

Yolk sac: Internal structure within the gestational sac may
represent early yolk sac.

Embryo:  Fetal pole is not identified.

Cardiac Activity: Not identified.

MSD: 8.2  mm   5 w   4  d                  US EDC: 05/21/2016

Subchorionic hemorrhage:  Indeterminate.

Maternal uterus/adnexae: Uterus is anteverted. No myometrial mass
lesions. The endometrium is expanded and heterogeneous. This could
represent a large subchorionic hemorrhage or an additional
endometrial process such as hemorrhage or hyperplasia. No
endometrial fluid collections are demonstrated other than a
gestational sac. Both ovaries are visualized and appear normal.
Corpus luteum cyst demonstrated on the right. Small amount of free
fluid in the pelvis.
IMPRESSION: Probable early intrauterine gestational sac, but no definite yolk
sac, fetal pole, or cardiac activity yet visualized. Recommend
follow-up quantitative B-HCG levels and follow-up US in 14 days to
confirm and assess viability. This recommendation follows SRU
consensus guidelines: Diagnostic Criteria for Nonviable Pregnancy
Early in the First Trimester. N Engl J Med 0479; [DATE].
Expansion and heterogeneous appearance of the endometrium could
represent a large subchorionic hemorrhage versus underlying
endometrial process. Follow-up suggested.

## 2017-01-05 ENCOUNTER — Emergency Department (HOSPITAL_BASED_OUTPATIENT_CLINIC_OR_DEPARTMENT_OTHER)
Admission: EM | Admit: 2017-01-05 | Discharge: 2017-01-05 | Disposition: A | Discharge status: Home / Self Care | Payer: Self-pay | Attending: Physician Assistant | Admitting: Physician Assistant

## 2017-01-05 ENCOUNTER — Encounter (HOSPITAL_BASED_OUTPATIENT_CLINIC_OR_DEPARTMENT_OTHER): Payer: Self-pay | Admitting: *Deleted

## 2017-01-05 DIAGNOSIS — W503XXA Accidental bite by another person, initial encounter: Secondary | ICD-10-CM

## 2017-01-05 DIAGNOSIS — S40272A Other superficial bite of left shoulder, initial encounter: Secondary | ICD-10-CM | POA: Insufficient documentation

## 2017-01-05 DIAGNOSIS — Y9389 Activity, other specified: Secondary | ICD-10-CM | POA: Insufficient documentation

## 2017-01-05 DIAGNOSIS — S20172A Other superficial bite of breast, left breast, initial encounter: Secondary | ICD-10-CM | POA: Insufficient documentation

## 2017-01-05 DIAGNOSIS — Y999 Unspecified external cause status: Secondary | ICD-10-CM | POA: Insufficient documentation

## 2017-01-05 DIAGNOSIS — Y929 Unspecified place or not applicable: Secondary | ICD-10-CM | POA: Insufficient documentation

## 2017-01-05 DIAGNOSIS — F17200 Nicotine dependence, unspecified, uncomplicated: Secondary | ICD-10-CM | POA: Insufficient documentation

## 2017-01-05 MED ORDER — AMOXICILLIN-POT CLAVULANATE 875-125 MG PO TABS
1.0000 | ORAL_TABLET | Freq: Two times a day (BID) | ORAL | 0 refills | Status: AC
Start: 2017-01-05 — End: 2017-01-15

## 2017-01-05 MED FILL — AMOX-CLAV 875-125 MG TABLET: 875-125 | 10 days supply | Qty: 20 | Fill #0
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
## 2017-01-05 NOTE — ED Notes (Signed)
ED Provider at bedside. 

## 2017-01-05 NOTE — Discharge Instructions (Signed)
[facility] return with signs of infection

## 2017-01-22 ENCOUNTER — Inpatient Hospital Stay (HOSPITAL_COMMUNITY)
Admission: AD | Admit: 2017-01-22 | Discharge: 2017-01-23 | Discharge status: Against Medical Advice | Payer: Medicaid Other | Source: Ambulatory Visit | Attending: Family Medicine | Admitting: Family Medicine

## 2017-01-22 DIAGNOSIS — Z5321 Procedure and treatment not carried out due to patient leaving prior to being seen by health care provider: Secondary | ICD-10-CM | POA: Insufficient documentation

## 2017-01-22 DIAGNOSIS — Z3200 Encounter for pregnancy test, result unknown: Secondary | ICD-10-CM | POA: Insufficient documentation

## 2017-01-22 NOTE — MAU Note (Signed)
NOT IN LOBBY 

## 2017-01-22 NOTE — Progress Notes (Signed)
Patient called. Not in lobby. Kim ShellerHeather Leanne Mccullough 11:54 PM 01/22/17
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
## 2017-01-22 NOTE — MAU Note (Signed)
Urine in lab 

## 2017-01-23 DIAGNOSIS — Z5321 Procedure and treatment not carried out due to patient leaving prior to being seen by health care provider: Secondary | ICD-10-CM | POA: Diagnosis not present

## 2017-01-23 DIAGNOSIS — Z3200 Encounter for pregnancy test, result unknown: Secondary | ICD-10-CM | POA: Diagnosis not present

## 2017-01-23 NOTE — MAU Note (Signed)
NOT IN LOBBY 

## 2017-01-25 ENCOUNTER — Encounter (HOSPITAL_COMMUNITY): Payer: Self-pay | Admitting: *Deleted

## 2017-01-25 ENCOUNTER — Inpatient Hospital Stay (HOSPITAL_COMMUNITY)
Admission: AD | Admit: 2017-01-25 | Discharge: 2017-01-25 | Disposition: A | Discharge status: Home / Self Care | Payer: Medicaid Other | Source: Ambulatory Visit | Attending: Obstetrics & Gynecology | Admitting: Obstetrics & Gynecology

## 2017-01-25 DIAGNOSIS — Z3201 Encounter for pregnancy test, result positive: Secondary | ICD-10-CM | POA: Diagnosis not present

## 2017-01-25 DIAGNOSIS — R109 Unspecified abdominal pain: Secondary | ICD-10-CM | POA: Insufficient documentation

## 2017-01-25 LAB — URINALYSIS, ROUTINE W REFLEX MICROSCOPIC
Bilirubin Urine: NEGATIVE
Glucose, UA: NEGATIVE mg/dL
Hgb urine dipstick: NEGATIVE
Ketones, ur: NEGATIVE mg/dL
Leukocytes, UA: NEGATIVE
Nitrite: NEGATIVE
PH: 7 (ref 5.0–8.0)
Protein, ur: 30 mg/dL — AB
SPECIFIC GRAVITY, URINE: 1.025 (ref 1.005–1.030)

## 2017-01-25 LAB — POCT PREGNANCY, URINE: PREG TEST UR: POSITIVE — AB

## 2017-01-25 NOTE — MAU Note (Signed)
Pt was her a few days ago for a pregnancy test left before results was given. Pt still having abd pain and cramping.

## 2017-12-18 ENCOUNTER — Encounter (HOSPITAL_COMMUNITY): Payer: Self-pay

## 2022-05-15 ENCOUNTER — Emergency Department (HOSPITAL_BASED_OUTPATIENT_CLINIC_OR_DEPARTMENT_OTHER)
Admission: EM | Admit: 2022-05-15 | Discharge: 2022-05-15 | Disposition: A | Discharge status: Home / Self Care | Payer: Medicaid Other | Attending: Emergency Medicine | Admitting: Emergency Medicine

## 2022-05-15 ENCOUNTER — Encounter (HOSPITAL_BASED_OUTPATIENT_CLINIC_OR_DEPARTMENT_OTHER): Payer: Self-pay | Admitting: Urology

## 2022-05-15 DIAGNOSIS — J069 Acute upper respiratory infection, unspecified: Secondary | ICD-10-CM

## 2022-05-15 DIAGNOSIS — R0981 Nasal congestion: Secondary | ICD-10-CM | POA: Insufficient documentation

## 2022-05-15 DIAGNOSIS — R059 Cough, unspecified: Secondary | ICD-10-CM | POA: Diagnosis not present

## 2022-05-15 DIAGNOSIS — Z20822 Contact with and (suspected) exposure to covid-19: Secondary | ICD-10-CM | POA: Diagnosis not present

## 2022-05-15 DIAGNOSIS — J029 Acute pharyngitis, unspecified: Secondary | ICD-10-CM | POA: Insufficient documentation

## 2022-05-15 LAB — RESP PANEL BY RT-PCR (FLU A&B, COVID) ARPGX2
Influenza A by PCR: NEGATIVE
Influenza B by PCR: NEGATIVE
SARS Coronavirus 2 by RT PCR: NEGATIVE

## 2022-05-15 LAB — GROUP A STREP BY PCR: Group A Strep by PCR: NOT DETECTED

## 2022-05-15 NOTE — Discharge Instructions (Signed)

## 2022-05-15 NOTE — ED Provider Notes (Signed)
[facility] DEPARTMENT Provider Note   CSN: [id number] Arrival date & time: 05/15/22  1426     History  Chief Complaint  Patient presents with   URI    Kim Mccullough is a 31 y.o. female.  Patient is presenting to the emergency department with upper respiratory symptoms since yesterday.  She has a sore throat, nasal congestion, productive cough with yellow sputum.  She has had some relief with over-the-counter TheraFlu.  She denies any fevers, chills, chest pain, shortness of breath, difficulty swallowing, abdominal pain, nausea, vomiting.  No sick contacts.  URI Presenting symptoms: congestion, cough and sore throat        Home Medications Prior to Admission medications   Not on File      Allergies    Patient has no known allergies.    Review of Systems   Review of Systems  HENT:  Positive for congestion and sore throat.   Respiratory:  Positive for cough.   All other systems reviewed and are negative.   Physical Exam Updated Vital Signs BP 112/81 (BP Location: Right Arm)   Pulse 95   Temp 98.1 F (36.7 C) (Oral)   Resp 18   Ht 5\' 3"  (1.6 m)   Wt 69.9 kg   LMP 04/13/2022 (Approximate)   SpO2 99%   BMI 27.28 kg/m  Physical Exam Vitals and nursing note reviewed.  Constitutional:      General: She is not in acute distress.    Appearance: Normal appearance. She is well-developed. She is not ill-appearing, toxic-appearing or diaphoretic.  HENT:     Head: Normocephalic and atraumatic.     Right Ear: Tympanic membrane, ear canal and external ear normal. There is no impacted cerumen.     Left Ear: Tympanic membrane, ear canal and external ear normal. There is no impacted cerumen.     Nose: Congestion present. No nasal deformity.     Mouth/Throat:     Lips: Pink. No lesions.     Mouth: Mucous membranes are moist.     Pharynx: Oropharynx is clear. Uvula midline. No pharyngeal swelling, oropharyngeal exudate, posterior oropharyngeal  erythema or uvula swelling.     Tonsils: No tonsillar exudate or tonsillar abscesses. 0 on the right. 0 on the left.  Eyes:     General: Gaze aligned appropriately. No scleral icterus.       Right eye: No discharge.        Left eye: No discharge.     Conjunctiva/sclera: Conjunctivae normal.     Right eye: Right conjunctiva is not injected. No exudate or hemorrhage.    Left eye: Left conjunctiva is not injected. No exudate or hemorrhage. Cardiovascular:     Rate and Rhythm: Normal rate and regular rhythm.     Pulses: Normal pulses.     Heart sounds: Normal heart sounds. No murmur heard.    No friction rub. No gallop.  Pulmonary:     Effort: Pulmonary effort is normal. No respiratory distress.     Breath sounds: Normal breath sounds. No stridor. No wheezing, rhonchi or rales.  Abdominal:     General: Abdomen is flat. There is no distension.     Palpations: Abdomen is soft.     Tenderness: There is no abdominal tenderness. There is no guarding or rebound.  Musculoskeletal:     Right lower leg: No edema.     Left lower leg: No edema.  Lymphadenopathy:     Cervical: No cervical adenopathy.  Skin:    General: Skin is warm and dry.  Neurological:     Mental Status: She is alert and oriented to person, place, and time.  Psychiatric:        Mood and Affect: Mood normal.        Speech: Speech normal.        Behavior: Behavior normal. Behavior is cooperative.     ED Results / Procedures / Treatments   Labs (all labs ordered are listed, but only abnormal results are displayed) Labs Reviewed  RESP PANEL BY RT-PCR (FLU A&B, COVID) ARPGX2  GROUP A STREP BY PCR    EKG None  Radiology No results found.  Procedures Procedures   Medications Ordered in ED Medications - No data to display  ED Course/ Medical Decision Making/ A&P                           Medical Decision Making  Patient is here with 1 day of upper respiratory symptoms.  She is afebrile with stable vitals.   HEENT exam unremarkable with no signs of PTA, or other deep space infection.  Lung sounds are clear with no signs of lower respiratory involvement.  She had a negative COVID and flu test today.  Suspect upper respiratory virus.  Recommend supportive treatment at home.  She is stable for discharge.   Final Clinical Impression(s) / ED Diagnoses Final diagnoses:  None    Rx / DC Orders ED Discharge Orders     None         Adolphus Birchwood, PA-C 05/15/22 1659    Hayden Rasmussen, MD 05/16/22 1048

## 2022-05-15 NOTE — ED Triage Notes (Signed)
Pt states sore throat, sinus congestion, runny nose since yesterday  Denies fever

## 2022-05-15 NOTE — ED Notes (Signed)
Called for in lobby x 2 with no answer 

## 2022-09-18 ENCOUNTER — Emergency Department (HOSPITAL_BASED_OUTPATIENT_CLINIC_OR_DEPARTMENT_OTHER)
Admission: EM | Admit: 2022-09-18 | Discharge: 2022-09-18 | Disposition: A | Discharge status: Home / Self Care | Payer: Medicaid Other | Attending: Emergency Medicine | Admitting: Emergency Medicine

## 2022-09-18 DIAGNOSIS — Z20822 Contact with and (suspected) exposure to covid-19: Secondary | ICD-10-CM | POA: Insufficient documentation

## 2022-09-18 DIAGNOSIS — R519 Headache, unspecified: Secondary | ICD-10-CM | POA: Insufficient documentation

## 2022-09-18 LAB — RESP PANEL BY RT-PCR (RSV, FLU A&B, COVID)  RVPGX2
Influenza A by PCR: NEGATIVE
Influenza B by PCR: NEGATIVE
Resp Syncytial Virus by PCR: NEGATIVE
SARS Coronavirus 2 by RT PCR: NEGATIVE

## 2022-09-18 MED ORDER — DIPHENHYDRAMINE HCL 50 MG/ML IJ SOLN
12.5000 mg | Freq: Once | INTRAMUSCULAR | Status: AC
  Administered 2022-09-18: 12.5 mg via INTRAVENOUS
  Filled 2022-09-18: qty 1

## 2022-09-18 MED ORDER — KETOROLAC TROMETHAMINE 15 MG/ML IJ SOLN
15.0000 mg | Freq: Once | INTRAMUSCULAR | Status: AC
  Administered 2022-09-18: 15 mg via INTRAVENOUS
  Filled 2022-09-18: qty 1

## 2022-09-18 MED ORDER — ACETAMINOPHEN 325 MG PO TABS
650.0000 mg | ORAL_TABLET | Freq: Once | ORAL | Status: AC
  Administered 2022-09-18: 650 mg via ORAL
  Filled 2022-09-18: qty 2

## 2022-09-18 NOTE — Discharge Instructions (Signed)
Evaluation today for your headache was overall reassuring.  Recommend they continue treating it conservatively at home and follow-up with your PCP.  If you have new facial droop, slurred speech, weakness or numbness in your extremities, changes in your gait or any other concerning symptom please return emergency department further evaluation.

## 2022-09-18 NOTE — ED Triage Notes (Signed)
Complains of HA, body aches and weakness since yesterday. Pt asks to check iron

## 2022-09-18 NOTE — ED Provider Notes (Signed)
[facility] Note   CSN: [id number] Arrival date & time: 09/18/22  [id number]     History  Chief Complaint  Patient presents with   Headache   HPI Kim Mccullough is a 32 y.o. female with history of tubal ligation presenting for headache.  Start started last night.  Patient characterizes as mild but persistent.  States she took some ibuprofen and her symptoms did not improve.  Denies fever, nuchal rigidity or visual disturbance.  Denies cough congestion.  Also states that she has been generally weak since yesterday as well.  Denies photophobia and phonophobia.   Headache      Home Medications Prior to Admission medications   Not on File      Allergies    Patient has no known allergies.    Review of Systems   Review of Systems  Neurological:  Positive for headaches.    Physical Exam Updated Vital Signs BP 134/66   Pulse 97   Temp 98.4 F (36.9 C)   Resp 16   Ht '5\' 2"'$  (1.575 m)   Wt 70.3 kg   LMP 08/19/2022   SpO2 96%   BMI 28.35 kg/m  Physical Exam Vitals and nursing note reviewed.  HENT:     Head: Normocephalic and atraumatic.     Mouth/Throat:     Mouth: Mucous membranes are moist.  Eyes:     General:        Right eye: No discharge.        Left eye: No discharge.     Conjunctiva/sclera: Conjunctivae normal.  Cardiovascular:     Rate and Rhythm: Normal rate and regular rhythm.     Pulses: Normal pulses.     Heart sounds: Normal heart sounds.  Pulmonary:     Effort: Pulmonary effort is normal.     Breath sounds: Normal breath sounds.  Abdominal:     General: Abdomen is flat.     Palpations: Abdomen is soft.  Skin:    General: Skin is warm and dry.  Neurological:     General: No focal deficit present.     Comments: GCS 15. Speech is goal oriented. No deficits appreciated to CN III-XII; symmetric eyebrow raise, no facial drooping, tongue midline. Patient has equal grip strength bilaterally  with 5/5 strength against resistance in all major muscle groups bilaterally. Sensation to light touch intact. Patient moves extremities without ataxia. Normal finger-nose-finger. Patient ambulatory with steady gait.   Psychiatric:        Mood and Affect: Mood normal.     ED Results / Procedures / Treatments   Labs (all labs ordered are listed, but only abnormal results are displayed) Labs Reviewed  RESP PANEL BY RT-PCR (RSV, FLU A&B, COVID)  RVPGX2    EKG None  Radiology No results found.  Procedures Procedures    Medications Ordered in ED Medications  ketorolac (TORADOL) 15 MG/ML injection 15 mg (15 mg Intravenous Given 09/18/22 1006)  acetaminophen (TYLENOL) tablet 650 mg (650 mg Oral Given 09/18/22 1004)  diphenhydrAMINE (BENADRYL) injection 12.5 mg (12.5 mg Intravenous Given 09/18/22 1007)    ED Course/ Medical Decision Making/ A&P                             Medical Decision Making  32 year old female who is well-appearing and hemodynamically stable presenting for headache.  Exam is unremarkable without focal neurodeficit.  DDx includes  SAH, hypertensive emergency, migraine, tension or cluster headache.  Treated headache with Benadryl Toradol and Tylenol.  Upon reevaluation patient stated that she felt better.  Low suspicion for intracranial bleed given no focal neurodeficit and headache was gradual in onset and mild.  Symptoms consistent with tension type.  Advised that she continue conservative treatment at home for headache and that she should follow-up with her PCP.  Discussed return precautions.        Final Clinical Impression(s) / ED Diagnoses Final diagnoses:  Nonintractable headache, unspecified chronicity pattern, unspecified headache type    Rx / DC Orders ED Discharge Orders     None         Harriet Pho, PA-C 09/18/22 1014    [address] [place], DO 09/18/22 1529

## 2022-10-30 ENCOUNTER — Emergency Department (HOSPITAL_BASED_OUTPATIENT_CLINIC_OR_DEPARTMENT_OTHER)
Admission: EM | Admit: 2022-10-30 | Discharge: 2022-10-30 | Disposition: A | Discharge status: Home / Self Care | Payer: Medicaid Other | Attending: Emergency Medicine | Admitting: Emergency Medicine

## 2022-10-30 ENCOUNTER — Emergency Department (HOSPITAL_BASED_OUTPATIENT_CLINIC_OR_DEPARTMENT_OTHER): Payer: Medicaid Other

## 2022-10-30 ENCOUNTER — Encounter (HOSPITAL_BASED_OUTPATIENT_CLINIC_OR_DEPARTMENT_OTHER): Payer: Self-pay | Admitting: Emergency Medicine

## 2022-10-30 ENCOUNTER — Other Ambulatory Visit: Payer: Self-pay

## 2022-10-30 DIAGNOSIS — R079 Chest pain, unspecified: Secondary | ICD-10-CM

## 2022-10-30 DIAGNOSIS — D649 Anemia, unspecified: Secondary | ICD-10-CM | POA: Diagnosis not present

## 2022-10-30 DIAGNOSIS — Z1152 Encounter for screening for COVID-19: Secondary | ICD-10-CM | POA: Insufficient documentation

## 2022-10-30 DIAGNOSIS — M546 Pain in thoracic spine: Secondary | ICD-10-CM

## 2022-10-30 DIAGNOSIS — D72829 Elevated white blood cell count, unspecified: Secondary | ICD-10-CM | POA: Diagnosis not present

## 2022-10-30 LAB — COMPREHENSIVE METABOLIC PANEL
ALT: 25 U/L (ref 0–44)
AST: 23 U/L (ref 15–41)
Albumin: 3.8 g/dL (ref 3.5–5.0)
Alkaline Phosphatase: 69 U/L (ref 38–126)
Anion gap: 10 (ref 5–15)
BUN: 10 mg/dL (ref 6–20)
CO2: 25 mmol/L (ref 22–32)
Calcium: 9.3 mg/dL (ref 8.9–10.3)
Chloride: 102 mmol/L (ref 98–111)
Creatinine, Ser: 0.52 mg/dL (ref 0.44–1.00)
GFR, Estimated: >60 mL/min (ref >60)
Glucose, Bld: 102 mg/dL — ABNORMAL HIGH (ref 70–99)
Potassium: 4 mmol/L (ref 3.5–5.1)
Sodium: 137 mmol/L (ref 135–145)
Total Bilirubin: 0.2 mg/dL — ABNORMAL LOW (ref 0.3–1.2)
Total Protein: 7.5 g/dL (ref 6.5–8.1)

## 2022-10-30 LAB — CBC WITH DIFFERENTIAL/PLATELET
Abs Immature Granulocytes: 0.01 10*3/uL (ref 0.00–0.07)
Basophils Absolute: 0 10*3/uL (ref 0.0–0.1)
Basophils Relative: 0 %
Eosinophils Absolute: 0.1 10*3/uL (ref 0.0–0.5)
Eosinophils Relative: 3 %
HCT: 35.1 % — ABNORMAL LOW (ref 36.0–46.0)
Hemoglobin: 11.2 g/dL — ABNORMAL LOW (ref 12.0–15.0)
Immature Granulocytes: 0 %
Lymphocytes Relative: 58 %
Lymphs Abs: 1.9 10*3/uL (ref 0.7–4.0)
MCH: 22.9 pg — ABNORMAL LOW (ref 26.0–34.0)
MCHC: 31.9 g/dL (ref 30.0–36.0)
MCV: 71.8 fL — ABNORMAL LOW (ref 80.0–100.0)
Monocytes Absolute: 0.3 10*3/uL (ref 0.1–1.0)
Monocytes Relative: 10 %
Neutro Abs: 1 10*3/uL — ABNORMAL LOW (ref 1.7–7.7)
Neutrophils Relative %: 29 %
Platelets: 296 10*3/uL (ref 150–400)
RBC: 4.89 MIL/uL (ref 3.87–5.11)
RDW: 13.7 % (ref 11.5–15.5)
WBC: 3.3 10*3/uL — ABNORMAL LOW (ref 4.0–10.5)
nRBC: 0 % (ref 0.0–0.2)

## 2022-10-30 LAB — HCG, QUANTITATIVE, PREGNANCY: hCG, Beta Chain, Quant, S: <1 m[IU]/mL (ref <5)

## 2022-10-30 LAB — TROPONIN I (HIGH SENSITIVITY): Troponin I (High Sensitivity): <2 ng/L (ref <18)

## 2022-10-30 LAB — RESP PANEL BY RT-PCR (RSV, FLU A&B, COVID)  RVPGX2
Influenza A by PCR: NEGATIVE
Influenza B by PCR: NEGATIVE
Resp Syncytial Virus by PCR: NEGATIVE
SARS Coronavirus 2 by RT PCR: NEGATIVE

## 2022-10-30 LAB — D-DIMER, QUANTITATIVE: D-Dimer, Quant: 0.39 ug/mL-FEU (ref 0.00–0.50)

## 2022-10-30 MED ORDER — IBUPROFEN 800 MG PO TABS
800.0000 mg | ORAL_TABLET | Freq: Once | ORAL | Status: AC
  Administered 2022-10-30: 800 mg via ORAL
  Filled 2022-10-30: qty 1

## 2022-10-30 NOTE — Discharge Instructions (Addendum)
Your EKG, chest x-ray and laboratory workup was reassuring.  Your COVID-19 and influenza PCR testing is pending and will be available on the patient portal.  Recommend NSAIDs for pain control.  Symptoms are consistent with likely musculoskeletal back pain given your reassuring workup.  Recommend Tylenol and ibuprofen for pain control.

## 2022-10-30 NOTE — ED Triage Notes (Addendum)
Right shoulder pain and upper back x 1 week , radiating to upper chest . No injury . It happen when she moves too fast she stated . Adds, she Often has anxiety feelings

## 2022-10-30 NOTE — ED Provider Notes (Signed)
[facility] Provider Note   CSN: [id number] Arrival date & time: 10/30/22  1329     History  Chief Complaint  Patient presents with   Shoulder Pain    right    Kim Mccullough is a 32 y.o. female.   Shoulder Pain Associated symptoms: back pain      32 year old female with medical history significant for self-reported anxiety who presents to the emergency department with right-sided chest pain and right upper back pain for the past week.  The patient starts in her right upper back and radiates to her chest.  She denies any falls or trauma.  She denies any significant pain with range of motion.  She endorses mild cough and shortness of breath.  She denies any lower extremity swelling, history of DVT or PE.  She is not on estrogen containing contraceptives.  No hemoptysis.  She states that she currently has no active chest pain.  Home Medications Prior to Admission medications   Not on File      Allergies    Patient has no known allergies.    Review of Systems   Review of Systems  Cardiovascular:  Positive for chest pain.  Musculoskeletal:  Positive for back pain.  All other systems reviewed and are negative.   Physical Exam Updated Vital Signs BP 120/83 (BP Location: Left Arm)   Pulse (!) 112   Temp 98.4 F (36.9 C) (Oral)   Resp 20   Wt 67.1 kg   LMP 10/19/2022   SpO2 99%   BMI 27.07 kg/m  Physical Exam Vitals and nursing note reviewed.  Constitutional:      General: She is not in acute distress.    Appearance: She is well-developed.  HENT:     Head: Normocephalic and atraumatic.  Eyes:     Conjunctiva/sclera: Conjunctivae normal.  Cardiovascular:     Rate and Rhythm: Normal rate and regular rhythm.  Pulmonary:     Effort: Pulmonary effort is normal. No respiratory distress.     Breath sounds: Normal breath sounds.  Abdominal:     Palpations: Abdomen is soft.     Tenderness: There is no abdominal  tenderness.     Comments: Negative Murphy sign, no abdominal tenderness  Musculoskeletal:        General: No swelling.     Cervical back: Neck supple.     Comments: Tenderness to palpation of the right upper back, does not fully reproduce the patient's pain.  No midline back tenderness  Skin:    General: Skin is warm and dry.     Capillary Refill: Capillary refill takes less than 2 seconds.  Neurological:     Mental Status: She is alert.  Psychiatric:        Mood and Affect: Mood normal.     ED Results / Procedures / Treatments   Labs (all labs ordered are listed, but only abnormal results are displayed) Labs Reviewed  CBC WITH DIFFERENTIAL/PLATELET - Abnormal; Notable for the following components:      Result Value   WBC 3.3 (*)    Hemoglobin 11.2 (*)    HCT 35.1 (*)    MCV 71.8 (*)    MCH 22.9 (*)    Neutro Abs 1.0 (*)    All other components within normal limits  COMPREHENSIVE METABOLIC PANEL - Abnormal; Notable for the following components:   Glucose, Bld 102 (*)    Total Bilirubin 0.2 (*)  All other components within normal limits  RESP PANEL BY RT-PCR (RSV, FLU A&B, COVID)  RVPGX2  HCG, QUANTITATIVE, PREGNANCY  D-DIMER, QUANTITATIVE  TROPONIN I (HIGH SENSITIVITY)    EKG EKG Interpretation  Date/Time:  Monday October 30 2022 16:26:15 EDT Ventricular Rate:  94 PR Interval:  170 QRS Duration: 89 QT Interval:  345 QTC Calculation: 432 R Axis:   68 Text Interpretation: Sinus rhythm ST elev, probable normal early repol pattern Confirmed by Ernie Avena ([id number]) on 10/30/2022 4:30:32 PM  Radiology DG Chest Portable 1 View  Result Date: 10/30/2022 CLINICAL DATA:  Chest pain. EXAM: PORTABLE CHEST 1 VIEW COMPARISON:  None Available. FINDINGS: The heart size and mediastinal contours are within normal limits. Both lungs are clear. The visualized skeletal structures are unremarkable. IMPRESSION: No active disease. Electronically Signed   By: Lupita Raider M.D.   On:  10/30/2022 16:03    Procedures Procedures    Medications Ordered in ED Medications  ibuprofen (ADVIL) tablet 800 mg (has no administration in time range)    ED Course/ Medical Decision Making/ A&P                             Medical Decision Making Amount and/or Complexity of Data Reviewed Labs: ordered. Radiology: ordered.    32 year old female with medical history significant for self-reported anxiety who presents to the emergency department with right-sided chest pain and right upper back pain for the past week.  The patient starts in her right upper back and radiates to her chest.  She denies any falls or trauma.  She denies any significant pain with range of motion.  She endorses mild cough and shortness of breath.  She denies any lower extremity swelling, history of DVT or PE.  She is not on estrogen containing contraceptives.  No hemoptysis.  She states that she currently has no active chest pain.  On arrival, the patient was afebrile, tachycardic heart rate 112, BP 120/83, saturating 99% on room air.  On my evaluation, the patient had right upper back musculoskeletal tenderness, lungs clear to auscultation bilaterally, and abdomen that had negative Murphy sign, soft nontender, nondistended.  Differential diagnosis includes musculoskeletal pain, considered PE, PERC positive due to the patient's tachycardia, will obtain D-dimer, less likely ACS, considered pneumothorax, pneumonia, viral infection.  Low concern for cholelithiasis/cholecystitis given the patient's reassuring abdominal exam.  No recent falls or trauma.  Range of motion of the shoulder intact without pain.  Initial EKG revealed sinus rhythm, ventricular rate 94, ST segment changes consistent with likely benign early repolarization.  A chest x-ray revealed no active disease.  Laboratory evaluation significant for leukopenia to 3.3, hemoglobin mildly anemic to 11.2, microcytosis noted, CMP without electrolyte abnormality,  normal renal and liver function, D-dimer normal at 0.39 hCG normal.  COVID-19 and influenza PCR testing was collected and pending.  The patient was administered 800 mg of ibuprofen for suspected musculoskeletal back pain.  She was advised continued Tylenol and ibuprofen for continued outpatient management.  Overall I do not feel that a repeat troponin is warranted as the patient symptoms been ongoing for the past week.  Low concern for ACS.  Low suspicion for PE with a negative D-dimer.  No evidence of pneumothorax or pneumonia.  Informed the patient that her COVID-19 and influenza PCR testing would be available on the patient portal.  Overall stable for discharge and continued outpatient follow-up.    Final Clinical Impression(s) /  ED Diagnoses Final diagnoses:  Acute right-sided thoracic back pain  Right-sided chest pain    Rx / DC Orders ED Discharge Orders     None         Ernie Avena, MD 10/30/22 1656

## 2024-04-21 ENCOUNTER — Emergency Department (HOSPITAL_BASED_OUTPATIENT_CLINIC_OR_DEPARTMENT_OTHER)
Admission: EM | Admit: 2024-04-21 | Discharge: 2024-04-21 | Disposition: A | Discharge status: Home / Self Care | Attending: Emergency Medicine | Admitting: Emergency Medicine

## 2024-04-21 ENCOUNTER — Other Ambulatory Visit: Payer: Self-pay

## 2024-04-21 ENCOUNTER — Encounter (HOSPITAL_BASED_OUTPATIENT_CLINIC_OR_DEPARTMENT_OTHER): Payer: Self-pay

## 2024-04-21 DIAGNOSIS — L03031 Cellulitis of right toe: Secondary | ICD-10-CM | POA: Diagnosis not present

## 2024-04-21 DIAGNOSIS — M79674 Pain in right toe(s): Secondary | ICD-10-CM | POA: Diagnosis present

## 2024-04-21 MED ORDER — IBUPROFEN 800 MG PO TABS
800.0000 mg | ORAL_TABLET | Freq: Once | ORAL | Status: AC
  Administered 2024-04-21: 800 mg via ORAL
  Filled 2024-04-21: qty 1

## 2024-04-21 MED ORDER — DOXYCYCLINE HYCLATE 100 MG PO TABS
100.0000 mg | ORAL_TABLET | Freq: Once | ORAL | Status: AC
  Administered 2024-04-21: 100 mg via ORAL
  Filled 2024-04-21: qty 1

## 2024-04-21 MED ORDER — ACETAMINOPHEN 500 MG PO TABS
1000.0000 mg | ORAL_TABLET | Freq: Once | ORAL | Status: AC
  Administered 2024-04-21: 1000 mg via ORAL
  Filled 2024-04-21: qty 2

## 2024-04-21 MED ORDER — DOXYCYCLINE HYCLATE 100 MG PO CAPS: 100.0000 mg | ORAL_CAPSULE | Freq: Two times a day (BID) | ORAL | 0 refills | Status: AC

## 2024-04-21 NOTE — ED Provider Notes (Signed)
 [place] [facility] [place] Provider Note   CSN: [id number] Arrival date & time: 04/21/24  9146     Patient presents with: Toe Pain   Kim Mccullough is a 33 y.o. female with no significant past medical history presents with concern for right great toe pain ongoing for past 2 weeks.  She states she thinks the toe was infected.  She reports some brownish drainage out of the toe.  Denies any injuries or known wounds to the toe.  She states she thought she initially had an ingrown toenail, and when she went to the nail salon to get it taken out, the problem seem to get worse.  She denies any fever or chills.  She denies any history of diabetes.    Toe Pain       Prior to Admission medications   Medication Sig Start Date End Date Taking? Authorizing Provider  doxycycline (VIBRAMYCIN) 100 MG capsule Take 1 capsule (100 mg total) by mouth 2 (two) times daily. 04/21/24  Yes Veta Palma, PA-C    Allergies: Patient has no known allergies.    Review of Systems  Skin:  Positive for color change.    Updated Vital Signs BP 138/87 (BP Location: Right Arm)   Pulse 72   Temp 98.1 F (36.7 C) (Oral)   Resp 18   Ht 5' 2 (1.575 m)   LMP 04/07/2024 (Approximate)   SpO2 99%   BMI 27.07 kg/m   Physical Exam Vitals and nursing note reviewed.  Constitutional:      Appearance: Normal appearance.  HENT:     Head: Atraumatic.  Cardiovascular:     Comments: 2+ pedal pulses bilaterally Pulmonary:     Effort: Pulmonary effort is normal.  Musculoskeletal:     Comments: Right lower extremity:  General Edema of the pulp of the right great toe and skin surrounding the nailbed.  No areas of fluctuance.  Skin of right great toe does not appear erythematous.  No obvious wounds or abrasions to the right great toe. Right great toe not hot to touch. Right great toenail not ingrown  Palpation Tender to palpation over the skin surrounding the nailbed of  the right great toe and over the pulp of the right great toe.  Nontender over the phalanx   ROM Able to wiggle all toes of right foot, full range of motion of right ankle  Sensation: Sensation intact throughout the lower extremity   Neurological:     General: No focal deficit present.     Mental Status: She is alert.  Psychiatric:        Mood and Affect: Mood normal.        Behavior: Behavior normal.     (all labs ordered are listed, but only abnormal results are displayed) Labs Reviewed - No data to display  EKG: None  Radiology: No results found.   Procedures   Medications Ordered in the ED  doxycycline (VIBRA-TABS) tablet 100 mg (has no administration in time range)  ibuprofen  (ADVIL ) tablet 800 mg (has no administration in time range)  acetaminophen  (TYLENOL ) tablet 1,000 mg (has no administration in time range)                                    Medical Decision Making Risk OTC drugs. Prescription drug management.     Differential diagnosis includes but is not limited to cellulitis,  paronychia, felon, gout, septic arthritis, osteomyelitis  ED Course:  Upon initial evaluation, patient is well-appearing, stable vitals.  Reporting pain to the right great toe.  On exam, right great toe is swollen, particularly along the edge of the nailbed and over the pulp of the right great toe.  Skin does not appear erythematous, not warm to touch.  She has full range of motion of the right toe, not tender over the MTP, lower concern for gout.  Does not feel fluctuant, I do not feel this is consistent with paronychia at this time.  She is neurovascularly intact in the right foot and right great toe.  No fever, tachycardia, no concern for systemic infection.  She is not a diabetic, no overlying wounds or ulcers, able to fully wiggle the right great toe, lower concern for osteomyelitis or septic arthritis at this time.  Given the swelling of the toe, this seems consistent with  cellulitis.  She states that she has had some drainage of her toe, will treat with course of doxycycline.  She denies any chance of pregnancy.  Stable and appropriate for discharge home.  Medications Given: Doxycycline Ibuprofen  Tylenol   Impression: Right great toe cellulitis  Disposition:  The patient was discharged home with instructions to take 7-day course of doxycycline as prescribed.  Tylenol  and ibuprofen  as needed for pain.  Warm compresses to the toe to help with drainage. We discussed that this may need I&D back here in the ER if it comes to a head/becomes fluctuant and does not start draining on its own. Return precautions given.    This chart was dictated using voice recognition software, Dragon. Despite the best efforts of this provider to proofread and correct errors, errors may still occur which can change documentation meaning.       Final diagnoses:  Cellulitis of right toe    ED Discharge Orders          Ordered    doxycycline (VIBRAMYCIN) 100 MG capsule  2 times daily        04/21/24 0938               Veta Palma, PA-C 04/21/24 9050    Long, Joshua G, MD 04/21/24 1146

## 2024-04-21 NOTE — Discharge Instructions (Signed)
 It appears that you have a skin infection of your right great toe.  You have been prescribed an antibiotic called doxycycline to treat this infection. Take this antibiotic 2 times a day for the next 7 days.  You were given your first dose here today.  Take your next dose this evening.  Take the full course of your antibiotic even if you start feeling better. Antibiotics may cause you to have diarrhea.  This antibiotic can cause her skin to be more sensitive to sunlight.  Please wear sunscreen while in the sun.  Please keep your toe clean by washing it with soap and water daily.  Apply warm compresses to the 3 times a day for 10 minutes at a time to help promote drainage.  If the infection seems to come to a head (like a pimple) but does not drain on its own, please come back to the emergency room for reevaluation as it may need to be drained.  You may use up to 800mg  ibuprofen  every 8 hours as needed for pain.  Do not exceed 2.4g of ibuprofen  per day.  You were given your first dose here today  You may take up to 1000mg  of tylenol  every 6 hours as needed for pain.  Do not take more then 4g per day.  You were given your first dose here today  Please return to the ER for any fevers, worsening pain, any other new or concerning symptoms

## 2024-04-21 NOTE — ED Triage Notes (Signed)
 C/o right great toe pain x 2 weeks. States thought she had an ingrown toenail, went to nail salon last week and tried to get it out, pain worse since then.
